# Patient Record
Sex: Male | Born: 1970 | Race: Black or African American | Hispanic: No | Marital: Single | Smoking: Former smoker
Health system: Southern US, Community
[De-identification: ages and names within clinical notes are randomized; demographics above are authoritative.]

---

## 2005-06-05 ENCOUNTER — Emergency Department: Payer: Self-pay | Admitting: General Practice

## 2007-11-22 ENCOUNTER — Emergency Department: Payer: Self-pay | Admitting: Emergency Medicine

## 2007-11-22 ENCOUNTER — Other Ambulatory Visit: Payer: Self-pay

## 2009-04-29 ENCOUNTER — Emergency Department: Payer: Self-pay | Admitting: Emergency Medicine

## 2009-05-12 ENCOUNTER — Emergency Department: Payer: Self-pay | Admitting: Emergency Medicine

## 2010-01-07 ENCOUNTER — Emergency Department: Payer: Self-pay | Admitting: Emergency Medicine

## 2011-11-29 ENCOUNTER — Emergency Department: Payer: Self-pay | Admitting: Emergency Medicine

## 2012-07-26 ENCOUNTER — Emergency Department: Payer: Self-pay | Admitting: Emergency Medicine

## 2012-08-08 ENCOUNTER — Emergency Department: Payer: Self-pay | Admitting: Emergency Medicine

## 2012-08-08 LAB — BASIC METABOLIC PANEL
Anion Gap: 7 (ref 7–16)
Calcium, Total: 8.6 mg/dL (ref 8.5–10.1)
Chloride: 106 mmol/L (ref 98–107)
Co2: 25 mmol/L (ref 21–32)
Creatinine: 1.04 mg/dL (ref 0.60–1.30)
EGFR (African American): >60
EGFR (Non-African Amer.): >60
Glucose: 182 mg/dL — ABNORMAL HIGH (ref 65–99)
Sodium: 138 mmol/L (ref 136–145)

## 2012-08-08 LAB — CBC
HGB: 14.3 g/dL (ref 13.0–18.0)
MCH: 27.1 pg (ref 26.0–34.0)
MCV: 78 fL — ABNORMAL LOW (ref 80–100)
Platelet: 255 10*3/uL (ref 150–440)
RBC: 5.3 10*6/uL (ref 4.40–5.90)
WBC: 9 10*3/uL (ref 3.8–10.6)

## 2013-09-01 ENCOUNTER — Emergency Department: Payer: Self-pay | Admitting: Emergency Medicine

## 2014-12-02 ENCOUNTER — Emergency Department: Payer: Self-pay

## 2014-12-02 ENCOUNTER — Emergency Department
Admission: EM | Admit: 2014-12-02 | Discharge: 2014-12-02 | Disposition: A | Discharge status: Home / Self Care | Payer: Self-pay | Attending: Emergency Medicine | Admitting: Emergency Medicine

## 2014-12-02 ENCOUNTER — Encounter: Payer: Self-pay | Admitting: Emergency Medicine

## 2014-12-02 DIAGNOSIS — S0012XA Contusion of left eyelid and periocular area, initial encounter: Secondary | ICD-10-CM | POA: Insufficient documentation

## 2014-12-02 DIAGNOSIS — S0083XA Contusion of other part of head, initial encounter: Secondary | ICD-10-CM | POA: Insufficient documentation

## 2014-12-02 DIAGNOSIS — Y9289 Other specified places as the place of occurrence of the external cause: Secondary | ICD-10-CM | POA: Insufficient documentation

## 2014-12-02 DIAGNOSIS — Y9389 Activity, other specified: Secondary | ICD-10-CM | POA: Insufficient documentation

## 2014-12-02 DIAGNOSIS — Y998 Other external cause status: Secondary | ICD-10-CM | POA: Insufficient documentation

## 2014-12-02 MED ORDER — IBUPROFEN 800 MG PO TABS: 800.0000 mg | ORAL_TABLET | Freq: Three times a day (TID) | ORAL | Status: DC | PRN

## 2014-12-02 NOTE — Discharge Instructions (Signed)
Contusion °A contusion is a deep bruise. Contusions are the result of an injury that caused bleeding under the skin. The contusion may turn blue, purple, or yellow. Minor injuries will give you a painless contusion, but more severe contusions may stay painful and swollen for a few weeks.  °CAUSES  °A contusion is usually caused by a blow, trauma, or direct force to an area of the body. °SYMPTOMS  °· Swelling and redness of the injured area. °· Bruising of the injured area. °· Tenderness and soreness of the injured area. °· Pain. °DIAGNOSIS  °The diagnosis can be made by taking a history and physical exam. An X-ray, CT scan, or MRI may be needed to determine if there were any associated injuries, such as fractures. °TREATMENT  °Specific treatment will depend on what area of the body was injured. In general, the best treatment for a contusion is resting, icing, elevating, and applying cold compresses to the injured area. Over-the-counter medicines may also be recommended for pain control. Ask your caregiver what the best treatment is for your contusion. °HOME CARE INSTRUCTIONS  °· Put ice on the injured area. °¨ Put ice in a plastic bag. °¨ Place a towel between your skin and the bag. °¨ Leave the ice on for 15-20 minutes, 3-4 times a day, or as directed by your health care provider. °· Only take over-the-counter or prescription medicines for pain, discomfort, or fever as directed by your caregiver. Your caregiver may recommend avoiding anti-inflammatory medicines (aspirin, ibuprofen, and naproxen) for 48 hours because these medicines may increase bruising. °· Rest the injured area. °· If possible, elevate the injured area to reduce swelling. °SEEK IMMEDIATE MEDICAL CARE IF:  °· You have increased bruising or swelling. °· You have pain that is getting worse. °· Your swelling or pain is not relieved with medicines. °MAKE SURE YOU:  °· Understand these instructions. °· Will watch your condition. °· Will get help right  away if you are not doing well or get worse. °Document Released: 03/15/2005 Document Revised: 06/10/2013 Document Reviewed: 04/10/2011 °ExitCare® Patient Information ©2015 [employer]. This information is not intended to replace advice given to you by your health care provider. Make sure you discuss any questions you have with your health care provider. ° °
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
## 2014-12-15 ENCOUNTER — Encounter: Payer: Self-pay | Admitting: Medical Oncology

## 2014-12-15 ENCOUNTER — Emergency Department
Admission: EM | Admit: 2014-12-15 | Discharge: 2014-12-15 | Disposition: A | Discharge status: Home / Self Care | Payer: Self-pay | Attending: Emergency Medicine | Admitting: Emergency Medicine

## 2014-12-15 ENCOUNTER — Emergency Department: Payer: Self-pay

## 2014-12-15 DIAGNOSIS — R042 Hemoptysis: Secondary | ICD-10-CM | POA: Insufficient documentation

## 2014-12-15 MED ORDER — BENZONATATE 200 MG PO CAPS: 200.0000 mg | ORAL_CAPSULE | Freq: Three times a day (TID) | ORAL | Status: DC | PRN

## 2014-12-15 NOTE — Discharge Instructions (Signed)
Hemoptysis Hemoptysis means coughing up blood. The blood may come from the lungs and airways. It can also come from bleeding that occurs outside the lungs and airways. Coughing up blood can be a sign of a minor problem or a serious medical condition.  HOME CARE  Only take medicine as told by your doctor. Do not use medicines that help you stop coughing (cough suppressants) unless your doctor approves.  If you are given antibiotic medicine, take it as told. Finish it even if you start to feel better.  Do not smoke. Also avoid being around others when they are smoking.  Follow up with your doctor as told. GET HELP RIGHT AWAY IF:  You cough up bloody spit (mucus) for longer than a week.  You have a blood-producing cough that is severe or getting worse.  You have a blood-producing cough thatcomes and goes over time.  You have trouble breathing.   You throw up (vomit) blood.  You have bloody or black poop (stool).  You have chest pain.   You have night sweats.  You feel faint or pass out.   You have a fever or lasting symptoms for more than 2-3 days.  You have a fever and your symptoms suddenly get worse. MAKE SURE YOU:  Understand these instructions.  Will watch your condition.  Will get help right away if you are not doing well or get worse. Document Released: 05/22/2012 Document Reviewed: 05/22/2012 ExitCare Patient Information 2015 [employer]. This information is not intended to replace advice given to you by your health care provider. Make sure you discuss any questions you have with your health care provider.  

## 2014-12-15 NOTE — ED Notes (Signed)
Pt ambulatory to triage with reports that he has had nasal congestion and cough x 2 days. This am when he coughed he noticed a little pink in his sputum.

## 2014-12-15 NOTE — ED Notes (Signed)
See provider notes for full assessment 

## 2014-12-15 NOTE — ED Provider Notes (Signed)
[facility] Emergency Department Provider Note     Time seen: Time stent  I have reviewed the triage vital signs and the nursing notes.   HISTORY  Chief Complaint Cough    HPI [name] is a 44 y.o. male who presents ER for cough and congestion for 2 days. Patient states when he coughed he knows some pink or blood tinged sputum. Has not had a history of same, denies fevers chills other complaints. Patient states the cough has not been severe, denies any other complaints.   History reviewed. No pertinent past medical history.  There are no active problems to display for this patient.   History reviewed. No pertinent past surgical history.  Allergies Review of patient's allergies indicates no known allergies.  Social History History  Substance Use Topics  . Smoking status: Never Smoker   . Smokeless tobacco: Not on file  . Alcohol Use: Yes     Comment: daily- 2 40oz beer    Review of Systems Constitutional: Negative for fever. Eyes: Negative for visual changes. ENT: Negative for sore throat. Cardiovascular: Negative for chest pain.  Respiratory: Negative for shortness of breath. Positive for cough Gastrointestinal: Negative for abdominal pain, vomiting and diarrhea. Genitourinary: Negative for dysuria. Musculoskeletal: Negative for back pain. Skin: Negative for rash. Neurological: Negative for headaches, focal weakness or numbness.  10-point ROS otherwise negative.  ____________________________________________   PHYSICAL EXAM:  VITAL SIGNS: ED Triage Vitals  Enc Vitals Group     BP 12/15/14 1514 137/88 mmHg     Pulse Rate 12/15/14 1514 98     Resp 12/15/14 1514 20     Temp 12/15/14 1514 98.1 F (36.7 C)     Temp Source 12/15/14 1514 Oral     SpO2 12/15/14 1514 95 %     Weight 12/15/14 1514 210 lb (95.255 kg)     Height 12/15/14 1514  (1.803 m)     Head Cir --      Peak Flow --      Pain Score --      Pain Loc  --      Pain Edu? --      Excl. in GC? --    Constitutional: Alert and oriented. Well appearing and in no distress. Eyes: Conjunctivae are normal. PERRL. Normal extraocular movements. ENT   Head: Normocephalic and atraumatic.   Nose: No congestion/rhinnorhea.   Mouth/Throat: Mucous membranes are moist.   Neck: No stridor. Hematological/Lymphatic/Immunilogical: No cervical lymphadenopathy. Cardiovascular: Normal rate, regular rhythm. Normal and symmetric distal pulses are present in all extremities. No murmurs, rubs, or gallops. Respiratory: Normal respiratory effort without tachypnea nor retractions. Breath sounds are clear and equal bilaterally. No wheezes/rales/rhonchi. Gastrointestinal: Soft and nontender. No distention. No abdominal bruits. There is no CVA tenderness. Musculoskeletal: Nontender with normal range of motion in all extremities. No joint effusions.  No lower extremity tenderness nor edema. Neurologic:  Normal speech and language. No gross focal neurologic deficits are appreciated. Speech is normal. No gait instability. Skin:  Skin is warm, dry and intact. No rash noted. Psychiatric: Mood and affect are normal. Speech and behavior are normal. Patient exhibits appropriate insight and judgment.  ____________________________________________  ED COURSE:  Pertinent labs & imaging results that were available during my care of the patient were reviewed by me and considered in my medical decision making (see chart for details). Likely blood-tinged sputum from forceful coughing or bronchitis. Patient will receive chest x-ray and reevaluation ____________________________________________  RADIOLOGY Images were viewed by me  Chest x-ray is unremarkable  ____________________________________________  FINAL ASSESSMENT AND PLAN  Hemoptysis  Plan: Patient is no acute distress, old charts were reviewed. X-rays stable. He'll be discharged with cough medication and  encouraged to follow up with his doctor if symptoms worsen.   [name], [name], MD   Emily FilbertJonathan E Williams, MD 12/15/14 1600
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
## 2015-09-29 IMAGING — CR DG CHEST 2V
1 series · 2 of 2 positions shown · non-contrast
Comparison: September 01, 2013

CLINICAL DATA: Two day history of cough and congestion

EXAM:
CHEST  2 VIEW

[Series 1: dg chest 2 view · 0.14mm/px · 2 of 2 slices shown]
[im 1/2]
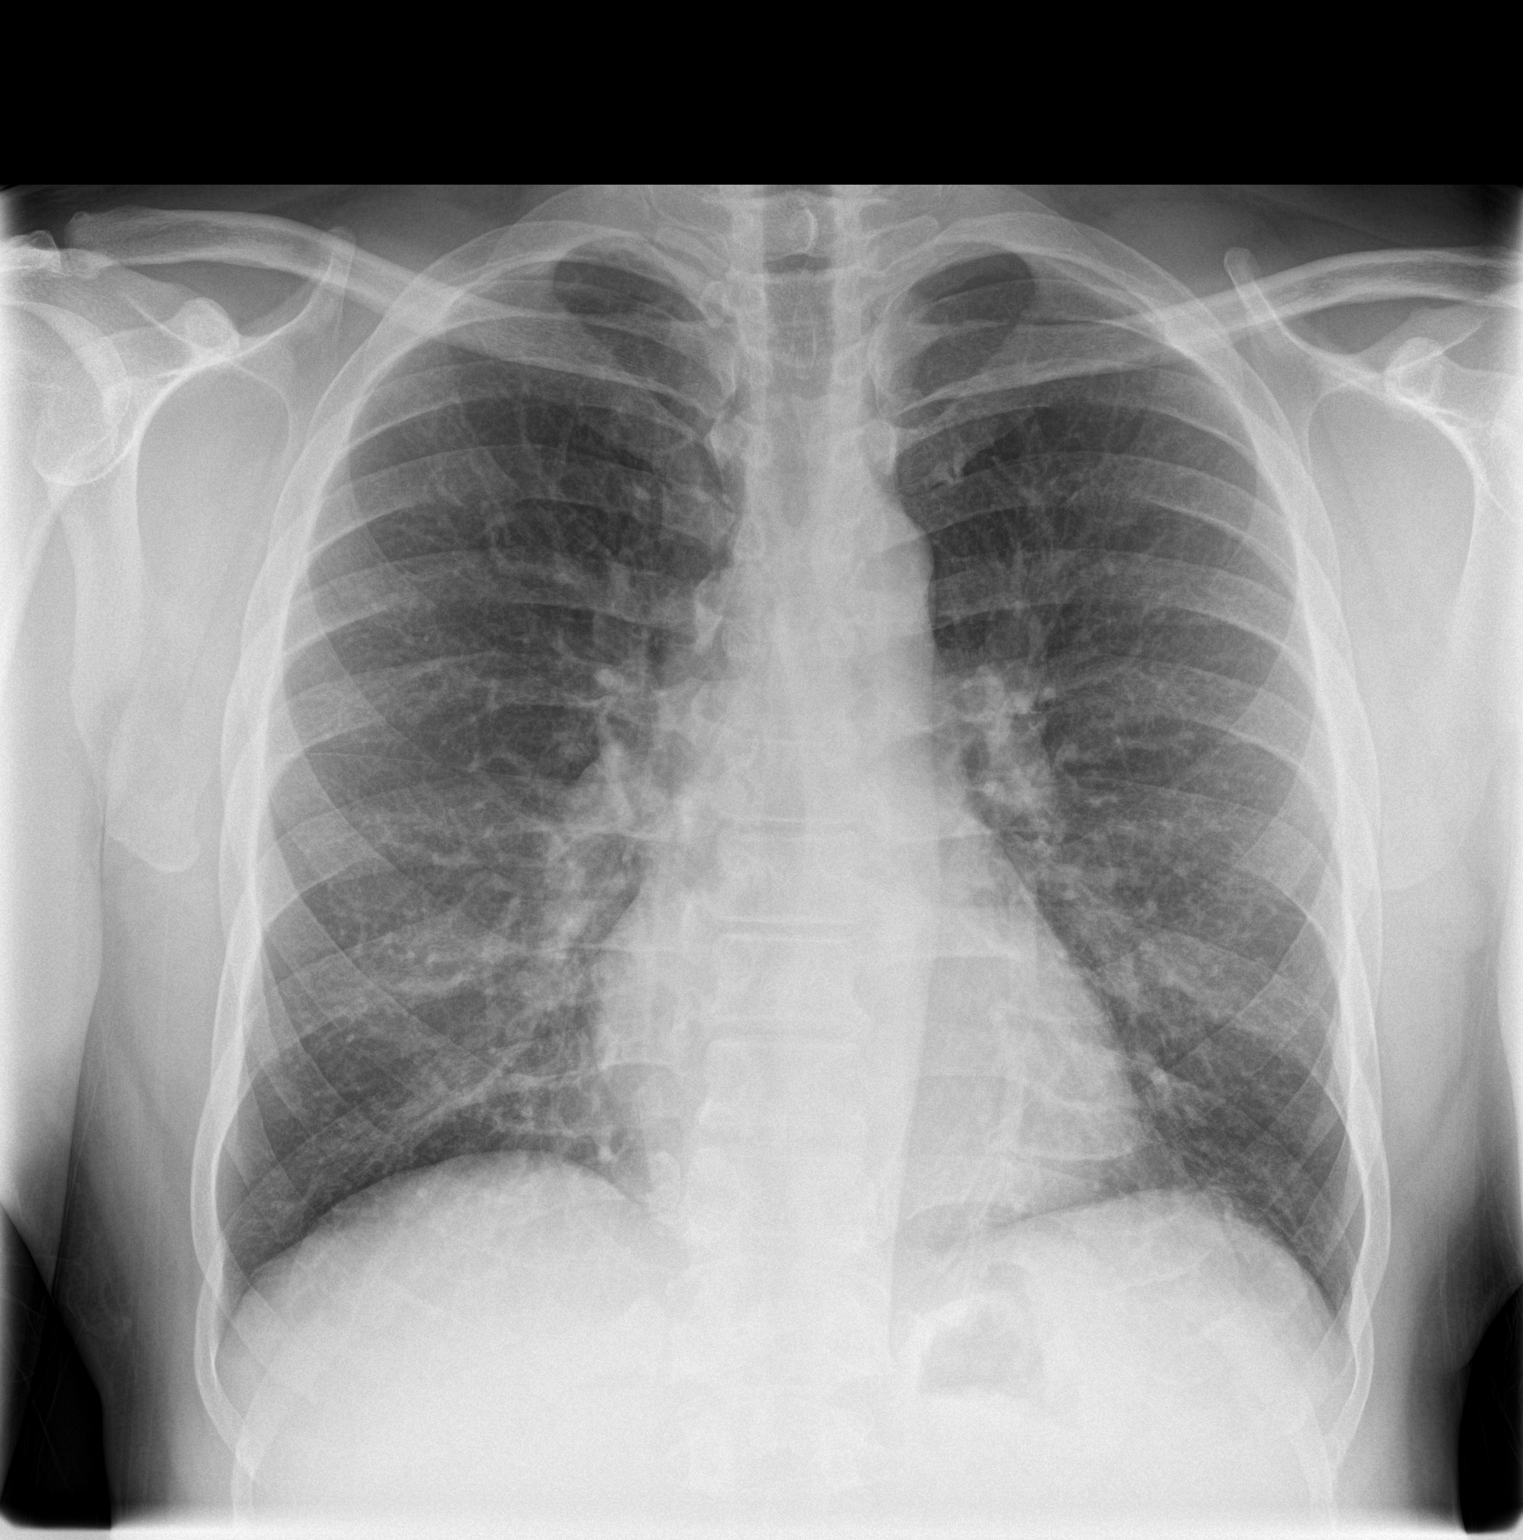
[im 2/2]
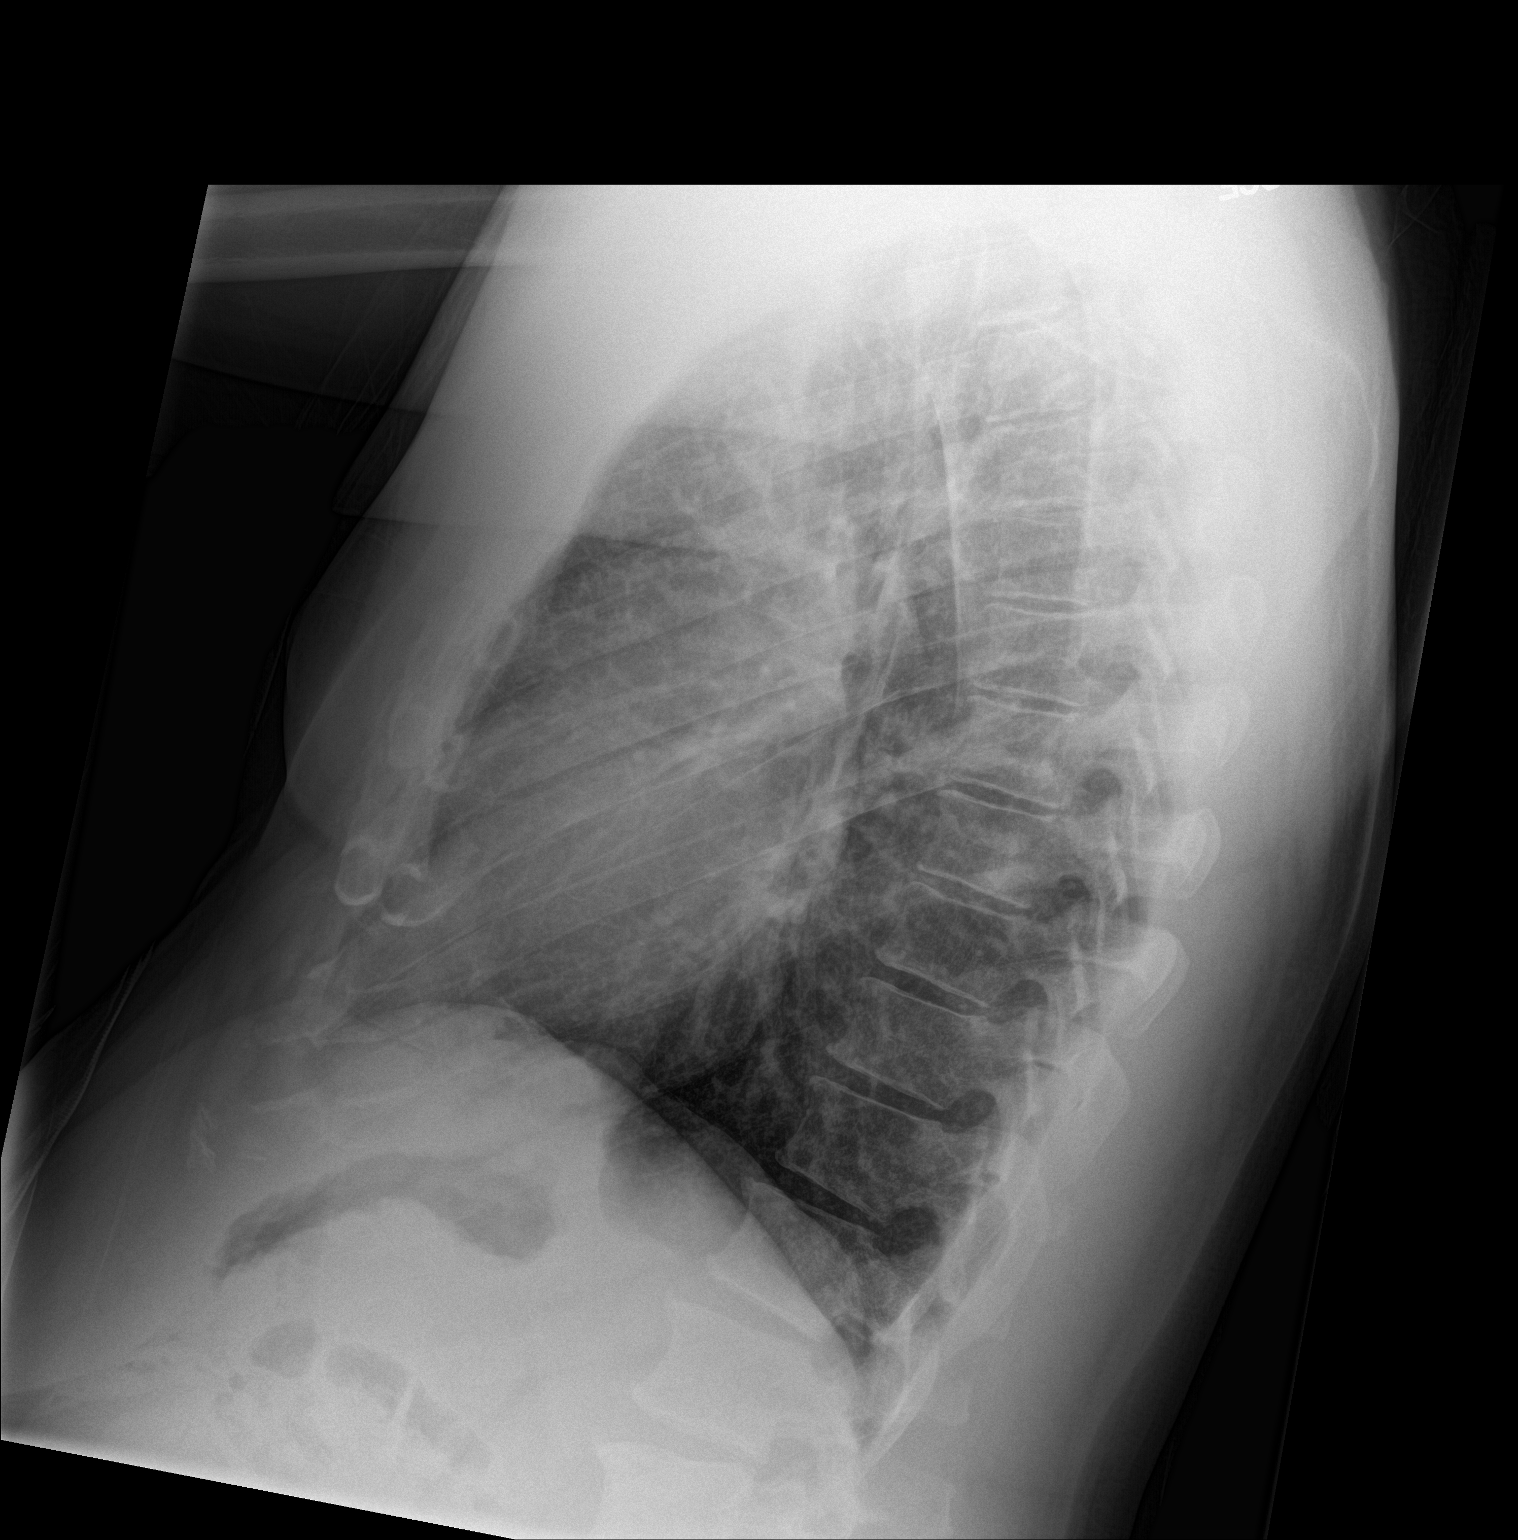

[2 of 2 positions shown; findings below may reference images not displayed]

FINDINGS: There is no edema or consolidation. The heart size and pulmonary
vascularity are normal. No adenopathy. No bone lesions.
IMPRESSION: No edema or consolidation.

## 2016-06-17 ENCOUNTER — Emergency Department
Admission: EM | Admit: 2016-06-17 | Discharge: 2016-06-17 | Disposition: A | Discharge status: Home / Self Care | Payer: Self-pay | Attending: Emergency Medicine | Admitting: Emergency Medicine

## 2016-06-17 ENCOUNTER — Emergency Department: Payer: Self-pay

## 2016-06-17 DIAGNOSIS — J101 Influenza due to other identified influenza virus with other respiratory manifestations: Secondary | ICD-10-CM

## 2016-06-17 DIAGNOSIS — J09X2 Influenza due to identified novel influenza A virus with other respiratory manifestations: Secondary | ICD-10-CM | POA: Insufficient documentation

## 2016-06-17 LAB — INFLUENZA PANEL BY PCR (TYPE A & B)
Influenza A By PCR: POSITIVE — AB
Influenza B By PCR: NEGATIVE

## 2016-06-17 MED ORDER — OSELTAMIVIR PHOSPHATE 75 MG PO CAPS: 75.0000 mg | ORAL_CAPSULE | Freq: Two times a day (BID) | ORAL | 0 refills | Status: AC

## 2016-06-17 NOTE — ED Notes (Signed)
Pt alert and oriented X4, active, cooperative, pt in NAD. RR even and unlabored, color WNL.  Pt informed to return if any life threatening symptoms occur.   

## 2016-06-17 NOTE — ED Triage Notes (Signed)
Pt states that he started with a bad cough last night

## 2016-06-18 NOTE — ED Provider Notes (Signed)
[facility] Emergency Department Provider Note  ____________________________________________  Time seen: Approximately 2:14 PM  I have reviewed the triage vital signs and the nursing notes.   HISTORY  Chief Complaint Cough    HPI Antonio Williams is a 45 y.o. male presenting to the emergency department with non-productive cough, malaise, myalgias and congestion. Patient states that symptoms started last night. However, nonproductive cough has been occurring for at least 4 days. Patient is unsure whether he has had fever. He has had chills. Patient denies chest pain, chest tightness, shortness of breath, abdominal pain, nausea, vomiting, dysuria and hematuria. He denies constipation and diarrhea. Patient has been around sick contacts. No recent travel. Patient has tried Tylenol, which has not relieved his symptoms.   No past medical history on file.  There are no active problems to display for this patient.   No past surgical history on file.  Prior to Admission medications   Medication Sig Start Date End Date Taking? Authorizing Provider  benzonatate (TESSALON) 200 MG capsule Take 1 capsule (200 mg total) by mouth 3 (three) times daily as needed for cough. 12/15/14   Emily FilbertJonathan E Williams, MD  ibuprofen (ADVIL,MOTRIN) 800 MG tablet Take 1 tablet (800 mg total) by mouth every 8 (eight) hours as needed. 12/02/14   Chinita Pesterari B Triplett, FNP  oseltamivir (TAMIFLU) 75 MG capsule Take 1 capsule (75 mg total) by mouth 2 (two) times daily. 06/17/16 06/22/16  Orvil FeilJaclyn M Woods, PA-C    Allergies Patient has no known allergies.  No family history on file.  Social History Social History  Substance Use Topics  . Smoking status: Never Smoker  . Smokeless tobacco: Not on file  . Alcohol use Yes     Comment: daily- 2 40oz beer     Review of Systems  Constitutional: Has chills Eyes: No visual changes. No discharge ENT: Has congestion and non productive  cough. Cardiovascular: no chest pain. Respiratory: No SOB. Gastrointestinal: No abdominal pain. No nausea, no vomiting.  No diarrhea.  No constipation. Genitourinary: Negative for dysuria. No hematuria. Musculoskeletal: Patient has myalgias. Skin: Negative for rash Neurological: Negative for headaches, focal weakness or numbness. 10-point ROS otherwise negative.  ____________________________________________   PHYSICAL EXAM:  VITAL SIGNS: ED Triage Vitals  Enc Vitals Group     BP 06/17/16 1250 (!) 136/97     Pulse Rate 06/17/16 1250 (!) 114     Resp 06/17/16 1250 20     Temp 06/17/16 1250 100.1 F (37.8 C)     Temp Source 06/17/16 1250 Oral     SpO2 06/17/16 1250 99 %     Weight 06/17/16 1251 205 lb (93 kg)     Height 06/17/16 1251 5\' 11"  (1.803 m)     Head Circumference --      Peak Flow --      Pain Score --      Pain Loc --      Pain Edu? --      Excl. in GC? --      Constitutional: Alert and oriented.  Eyes: Patient has bilateral conjunctivitis. Head: Atraumatic. ENT:      Ears: Tympanic membranes are injected bilaterally. No purulent exudate visualized bilaterally. Tympanic membranes are not bulging.      Nose: Nasal turbinates are erythematous. Copious rhinorrhea visualized.      Mouth/Throat: Mucous membranes are moist. The posterior pharynx is mildly erythematous. No tonsillar exudate or hypertrophy. Tonsils assessed at 2+. Uvula is midline. Hematological/Lymphatic/Immunilogical: No  cervical lymphadenopathy. Cardiovascular: Normal rate, regular rhythm. Normal S1 and S2.  Good peripheral circulation. No murmurs, gallops or rubs auscultated. Respiratory: Normal respiratory effort without tachypnea or retractions. Lungs CTAB. Good air entry to the bases with no decreased or absent breath sounds. Gastrointestinal: Bowel sounds 4 quadrants. Soft and nontender to palpation. No guarding or rigidity. No palpable masses. No distention. No CVA tenderness. Skin:  Skin is  warm, dry and intact. No rash noted. Psychiatric: Mood and affect are normal. Speech and behavior are normal. Patient exhibits appropriate insight and judgement.   ____________________________________________   LABS (all labs ordered are listed, but only abnormal results are displayed)  Labs Reviewed  INFLUENZA PANEL BY PCR (TYPE A & B, H1N1) - Abnormal; Notable for the following:       Result Value   Influenza A By PCR POSITIVE (*)    All other components within normal limits   ____________________________________________  EKG   ____________________________________________  RADIOLOGY Geraldo PitterI, Jaclyn M Woods, personally viewed and evaluated these images (plain radiographs) as part of my medical decision making, as well as reviewing the written report by the radiologist.  Dg Chest 2 View  Result Date: 06/17/2016 CLINICAL DATA:  Cough, congestion, runny nose X 4 days now. Nonsmoker. No previous heart or lung problems. EXAM: CHEST  2 VIEW COMPARISON:  12/15/2014 FINDINGS: The heart size and mediastinal contours are within normal limits. Both lungs are clear. The visualized skeletal structures are unremarkable. IMPRESSION: No active cardiopulmonary disease. Electronically Signed   By: Norva PavlovElizabeth  Brown M.D.   On: 06/17/2016 15:42    ____________________________________________    PROCEDURES  Procedure(s) performed:    Procedures    Medications - No data to display   ____________________________________________   INITIAL IMPRESSION / ASSESSMENT AND PLAN / ED COURSE  Pertinent labs & imaging results that were available during my care of the patient were reviewed by me and considered in my medical decision making (see chart for details).  Review of the [place] CSRS was performed in accordance of the [facility] prior to dispensing any controlled drugs.  Clinical Course     Assessment and Plan: Influenza A Patient presents to the emergency department with myalgias, nonproductive  cough, and fatigue. Patient tested positive for influenza A in the emergency department. DG chest reveals no consolidations or findings consistent with pneumonia. Patient education was provided regarding the course of influenza. Rest and hydration were encouraged. Patient was advised to follow-up with his primary care provider in one week. Tamiflu was prescribed at discharge. All patient questions were answered. Strict return precautions were given.    ____________________________________________  FINAL CLINICAL IMPRESSION(S) / ED DIAGNOSES  Final diagnoses:  Influenza A      NEW MEDICATIONS STARTED DURING THIS VISIT:  Discharge Medication List as of 06/17/2016  4:40 PM    START taking these medications   Details  oseltamivir (TAMIFLU) 75 MG capsule Take 1 capsule (75 mg total) by mouth 2 (two) times daily., Starting Sat 06/17/2016, Until Thu 06/22/2016, Print            This chart was dictated using voice recognition software/Dragon. Despite best efforts to proofread, errors can occur which can change the meaning. Any change was purely unintentional.    Orvil FeilJaclyn M Woods, PA-C 06/18/16 1425    Governor Rooksebecca Lord, MD 06/18/16 [phone]

## 2016-09-09 ENCOUNTER — Encounter: Payer: Self-pay | Admitting: Emergency Medicine

## 2016-09-09 ENCOUNTER — Emergency Department: Payer: Self-pay

## 2016-09-09 ENCOUNTER — Observation Stay
Admission: EM | Admit: 2016-09-09 | Discharge: 2016-09-10 | Disposition: A | Discharge status: Home / Self Care | Payer: Self-pay | Attending: Internal Medicine | Admitting: Internal Medicine

## 2016-09-09 DIAGNOSIS — Z833 Family history of diabetes mellitus: Secondary | ICD-10-CM | POA: Insufficient documentation

## 2016-09-09 DIAGNOSIS — J189 Pneumonia, unspecified organism: Principal | ICD-10-CM | POA: Diagnosis present

## 2016-09-09 DIAGNOSIS — R739 Hyperglycemia, unspecified: Secondary | ICD-10-CM | POA: Insufficient documentation

## 2016-09-09 DIAGNOSIS — Z87891 Personal history of nicotine dependence: Secondary | ICD-10-CM | POA: Insufficient documentation

## 2016-09-09 DIAGNOSIS — F1290 Cannabis use, unspecified, uncomplicated: Secondary | ICD-10-CM | POA: Insufficient documentation

## 2016-09-09 DIAGNOSIS — Z79899 Other long term (current) drug therapy: Secondary | ICD-10-CM | POA: Insufficient documentation

## 2016-09-09 DIAGNOSIS — E876 Hypokalemia: Secondary | ICD-10-CM | POA: Insufficient documentation

## 2016-09-09 DIAGNOSIS — Z8249 Family history of ischemic heart disease and other diseases of the circulatory system: Secondary | ICD-10-CM | POA: Insufficient documentation

## 2016-09-09 DIAGNOSIS — R0902 Hypoxemia: Secondary | ICD-10-CM | POA: Insufficient documentation

## 2016-09-09 LAB — COMPREHENSIVE METABOLIC PANEL
ALBUMIN: 3.8 g/dL (ref 3.5–5.0)
ALK PHOS: 66 U/L (ref 38–126)
ALT: 21 U/L (ref 17–63)
AST: 22 U/L (ref 15–41)
Anion gap: 8 (ref 5–15)
BILIRUBIN TOTAL: 0.7 mg/dL (ref 0.3–1.2)
BUN: 13 mg/dL (ref 6–20)
CALCIUM: 8.6 mg/dL — AB (ref 8.9–10.3)
CO2: 30 mmol/L (ref 22–32)
CREATININE: 1.14 mg/dL (ref 0.61–1.24)
Chloride: 98 mmol/L — ABNORMAL LOW (ref 101–111)
GFR calc Af Amer: >60 mL/min (ref >60)
GLUCOSE: 243 mg/dL — AB (ref 65–99)
POTASSIUM: 3.4 mmol/L — AB (ref 3.5–5.1)
Sodium: 136 mmol/L (ref 135–145)
TOTAL PROTEIN: 8.2 g/dL — AB (ref 6.5–8.1)

## 2016-09-09 LAB — CBC WITH DIFFERENTIAL/PLATELET
Basophils Absolute: 0 10*3/uL (ref 0–0.1)
Basophils Relative: 0 %
EOS ABS: 0.1 10*3/uL (ref 0–0.7)
Eosinophils Relative: 1 %
HEMATOCRIT: 41 % (ref 40.0–52.0)
HEMOGLOBIN: 14.4 g/dL (ref 13.0–18.0)
LYMPHS ABS: 1.6 10*3/uL (ref 1.0–3.6)
Lymphocytes Relative: 15 %
MCH: 26.5 pg (ref 26.0–34.0)
MCHC: 35.1 g/dL (ref 32.0–36.0)
MCV: 75.5 fL — AB (ref 80.0–100.0)
MONOS PCT: 7 %
Monocytes Absolute: 0.8 10*3/uL (ref 0.2–1.0)
NEUTROS PCT: 77 %
Neutro Abs: 8.7 10*3/uL — ABNORMAL HIGH (ref 1.4–6.5)
Platelets: 153 10*3/uL (ref 150–440)
RBC: 5.42 MIL/uL (ref 4.40–5.90)
RDW: 14.8 % — AB (ref 11.5–14.5)
WBC: 11.2 10*3/uL — AB (ref 3.8–10.6)

## 2016-09-09 LAB — GLUCOSE, CAPILLARY: GLUCOSE-CAPILLARY: 252 mg/dL — AB (ref 65–99)

## 2016-09-09 LAB — TROPONIN I

## 2016-09-09 LAB — BRAIN NATRIURETIC PEPTIDE: B Natriuretic Peptide: 28 pg/mL (ref 0.0–100.0)

## 2016-09-09 LAB — FIBRIN DERIVATIVES D-DIMER (ARMC ONLY): Fibrin derivatives D-dimer (ARMC): 427.34 (ref 0.00–499.00)

## 2016-09-09 MED ORDER — POTASSIUM CHLORIDE CRYS ER 20 MEQ PO TBCR
40.0000 meq | EXTENDED_RELEASE_TABLET | Freq: Once | ORAL | Status: AC
  Administered 2016-09-09: 40 meq via ORAL
  Filled 2016-09-09: qty 2

## 2016-09-09 MED ORDER — DEXTROSE 5 % IV SOLN: 1.0000 g | Freq: Once | INTRAVENOUS | Status: DC

## 2016-09-09 MED ORDER — DEXTROSE 5 % IV SOLN
500.0000 mg | INTRAVENOUS | Status: DC
  Filled 2016-09-09: qty 500

## 2016-09-09 MED ORDER — BENZONATATE 100 MG PO CAPS: 100.0000 mg | ORAL_CAPSULE | Freq: Three times a day (TID) | ORAL | Status: DC | PRN

## 2016-09-09 MED ORDER — AZITHROMYCIN 500 MG IV SOLR
500.0000 mg | Freq: Once | INTRAVENOUS | Status: AC
  Administered 2016-09-09: 500 mg via INTRAVENOUS
  Filled 2016-09-09: qty 500

## 2016-09-09 MED ORDER — DEXTROMETHORPHAN POLISTIREX ER 30 MG/5ML PO SUER
30.0000 mg | Freq: Two times a day (BID) | ORAL | Status: DC
  Administered 2016-09-09 – 2016-09-10 (×2): 30 mg via ORAL
  Filled 2016-09-09 (×4): qty 5

## 2016-09-09 MED ORDER — GUAIFENESIN ER 600 MG PO TB12
600.0000 mg | ORAL_TABLET | Freq: Two times a day (BID) | ORAL | Status: DC
  Administered 2016-09-09 – 2016-09-10 (×2): 600 mg via ORAL
  Filled 2016-09-09 (×2): qty 1

## 2016-09-09 MED ORDER — DM-GUAIFENESIN ER 30-600 MG PO TB12: 1.0000 | ORAL_TABLET | Freq: Two times a day (BID) | ORAL | Status: DC

## 2016-09-09 MED ORDER — CEFTRIAXONE SODIUM-DEXTROSE 1-3.74 GM-% IV SOLR
1.0000 g | Freq: Once | INTRAVENOUS | Status: AC
  Administered 2016-09-09: 1 g via INTRAVENOUS
  Filled 2016-09-09: qty 50

## 2016-09-09 MED ORDER — VANCOMYCIN HCL IN DEXTROSE 1-5 GM/200ML-% IV SOLN
1000.0000 mg | Freq: Once | INTRAVENOUS | Status: AC
  Administered 2016-09-09: 1000 mg via INTRAVENOUS
  Filled 2016-09-09: qty 200

## 2016-09-09 MED ORDER — INSULIN ASPART 100 UNIT/ML ~~LOC~~ SOLN
0.0000 [IU] | Freq: Every day | SUBCUTANEOUS | Status: DC
  Administered 2016-09-09: 3 [IU] via SUBCUTANEOUS
  Filled 2016-09-09: qty 3

## 2016-09-09 MED ORDER — IPRATROPIUM-ALBUTEROL 0.5-2.5 (3) MG/3ML IN SOLN: 3.0000 mL | Freq: Four times a day (QID) | RESPIRATORY_TRACT | Status: DC | PRN

## 2016-09-09 MED ORDER — SODIUM CHLORIDE 0.9 % IV SOLN
INTRAVENOUS | Status: DC
  Administered 2016-09-09: 23:00:00 via INTRAVENOUS

## 2016-09-09 MED ORDER — INSULIN ASPART 100 UNIT/ML ~~LOC~~ SOLN
0.0000 [IU] | Freq: Three times a day (TID) | SUBCUTANEOUS | Status: DC
  Administered 2016-09-10 (×2): 3 [IU] via SUBCUTANEOUS
  Filled 2016-09-09 (×2): qty 3

## 2016-09-09 MED ORDER — DEXTROSE 5 % IV SOLN
1.0000 g | INTRAVENOUS | Status: DC
  Filled 2016-09-09: qty 10

## 2016-09-09 NOTE — ED Notes (Signed)
zithromax and vanc were ordered prior to the rocephin order and that is why rocephin is being given after the other two atb

## 2016-09-09 NOTE — H&P (Signed)
History and Physical   [facility] [place] @ [facility] Admission History and Physical [employer], D.O.    Patient Name: Antonio Williams MR#: [id number] Date of Birth: 1971-02-11 Date of Admission: 09/09/2016  Referring MD/NP/PA: Dr. Darnelle Catalan Primary Care Physician: No PCP Per Patient Patient coming from: Home Outpatient Specialists: None   Chief Complaint: Cough, SOB  HPI: Antonio Williams is a 46 y.o. male with no known medical history presents to the emergency department for evaluation of fever, productive cough, SOB.  Patient was in a usual state of health until four days ago when he developed cough productive of brownish phlegm associated with exertional dyspnea and fevers/chills.  He has not sought any medical attention for his symptoms.    Otherwise there has been no change in status. Patient has been taking medication as prescribed and there has been no recent change in medication or diet.  No recent antibiotics.  There has been no recent illness, hospitalizations, travel or sick contacts.    Patient denies, weakness, dizziness, chest pain, N/V/C/D, abdominal pain, dysuria/frequency, changes in mental status.   ED Course: Patient received Vanco, Rocephin, Azithro.  Review of Systems:  CONSTITUTIONAL: Positive fever/chills, fatigue, weakness. Negative weight gain/loss, headache. EYES: No blurry or double vision. ENT: No tinnitus, postnasal drip, redness or soreness of the oropharynx. RESPIRATORY: Positive cough, dyspnea, wheeze.  No hemoptysis.  CARDIOVASCULAR: No chest pain, palpitations, syncope, orthopnea. No lower extremity edema.  GASTROINTESTINAL: No nausea, vomiting, abdominal pain, diarrhea, constipation.  No hematemesis, melena or hematochezia. GENITOURINARY: No dysuria, frequency, hematuria. ENDOCRINE: No polyuria or nocturia. No heat or cold intolerance. HEMATOLOGY: No anemia, bruising, bleeding. INTEGUMENTARY: No rashes, ulcers, lesions. MUSCULOSKELETAL:  No arthritis, gout, dyspnea. NEUROLOGIC: No numbness, tingling, ataxia, seizure-type activity, weakness. PSYCHIATRIC: No anxiety, depression, insomnia.   PMH:  Remote history of asthma in childhood  History reviewed. No pertinent surgical history.   reports that he has quit smoking. He has never used smokeless tobacco. He reports that he drinks alcohol. He reports that he uses drugs, including Marijuana.  No Known Allergies  Mother died with CHF, complications from DM. Father alive and well.  Family history has been reviewed and confirmed with patient.   Prior to Admission medications   Medication Sig Start Date End Date Taking? Authorizing Provider  benzonatate (TESSALON) 200 MG capsule Take 1 capsule (200 mg total) by mouth 3 (three) times daily as needed for cough. 12/15/14   Emily Filbert, MD  ibuprofen (ADVIL,MOTRIN) 800 MG tablet Take 1 tablet (800 mg total) by mouth every 8 (eight) hours as needed. 12/02/14   Chinita Pester, FNP    Physical Exam: Vitals:   09/09/16 1805 09/09/16 1830  BP: (!) 147/88 (!) 167/100  Pulse: (!) 121 (!) 110  Resp: 18   Temp: 98.7 F (37.1 C)   TempSrc: Oral   SpO2: 94% 100%  Weight: 95.3 kg (210 lb)   Height: 5\' 9"  (1.753 m)     GENERAL: 46 y.o.-year-old male patient, well-developed, well-nourished lying in the bed in no acute distress.  Pleasant and cooperative.   HEENT: Head atraumatic, normocephalic. Pupils equal, round, reactive to light and accommodation. No scleral icterus. Extraocular muscles intact. Nares are patent. Oropharynx is clear. Mucus membranes moist. NECK: Supple, full range of motion. No JVD, no bruit heard. No thyroid enlargement, no tenderness, no cervical lymphadenopathy. CHEST: Scattered rhonchi.. No use of accessory muscles of respiration.  No reproducible chest wall tenderness.  CARDIOVASCULAR: S1, S2 normal. No murmurs,  rubs, or gallops. Cap refill <2 seconds. Pulses intact distally.  ABDOMEN: Soft,  nondistended, nontender. No rebound, guarding, rigidity. Normoactive bowel sounds present in all four quadrants. No organomegaly or mass. EXTREMITIES: No pedal edema, cyanosis, or clubbing. No calf tenderness or Homan's sign.  NEUROLOGIC: The patient is alert and oriented x 3. Cranial nerves II through XII are grossly intact with no focal sensorimotor deficit. Muscle strength 5/5 in all extremities. Sensation intact. Gait not checked. PSYCHIATRIC:  Normal affect, mood, thought content. SKIN: Warm, dry, and intact without obvious rash, lesion, or ulcer.    Labs on Admission:  CBC:  Recent Labs Lab 09/09/16 1820  WBC 11.2*  NEUTROABS 8.7*  HGB 14.4  HCT 41.0  MCV 75.5*  PLT 153   Basic Metabolic Panel:  Recent Labs Lab 09/09/16 1820  NA 136  K 3.4*  CL 98*  CO2 30  GLUCOSE 243*  BUN 13  CREATININE 1.14  CALCIUM 8.6*   GFR: Estimated Creatinine Clearance: 90.2 mL/min (by C-G formula based on SCr of 1.14 mg/dL). Liver Function Tests:  Recent Labs Lab 09/09/16 1820  AST 22  ALT 21  ALKPHOS 66  BILITOT 0.7  PROT 8.2*  ALBUMIN 3.8   No results for input(s): LIPASE, AMYLASE in the last 168 hours. No results for input(s): AMMONIA in the last 168 hours. Coagulation Profile: No results for input(s): INR, PROTIME in the last 168 hours. Cardiac Enzymes:  Recent Labs Lab 09/09/16 1820  TROPONINI <0.03   BNP (last 3 results) No results for input(s): PROBNP in the last 8760 hours. HbA1C: No results for input(s): HGBA1C in the last 72 hours. CBG: No results for input(s): GLUCAP in the last 168 hours. Lipid Profile: No results for input(s): CHOL, HDL, LDLCALC, TRIG, CHOLHDL, LDLDIRECT in the last 72 hours. Thyroid Function Tests: No results for input(s): TSH, T4TOTAL, FREET4, T3FREE, THYROIDAB in the last 72 hours. Anemia Panel: No results for input(s): VITAMINB12, FOLATE, FERRITIN, TIBC, IRON, RETICCTPCT in the last 72 hours. Urine analysis: No results found  for: COLORURINE, APPEARANCEUR, LABSPEC, PHURINE, GLUCOSEU, HGBUR, BILIRUBINUR, KETONESUR, PROTEINUR, UROBILINOGEN, NITRITE, LEUKOCYTESUR Sepsis Labs: @LABRCNTIP (procalcitonin:4,lacticidven:4) )No results found for this or any previous visit (from the past 240 hour(s)).   Radiological Exams on Admission: Dg Chest 2 View  Result Date: 09/09/2016 CLINICAL DATA:  Acute onset of productive cough and shortness of breath. Intermittent fever. Initial encounter. EXAM: CHEST  2 VIEW COMPARISON:  Chest radiograph performed 06/17/2016 FINDINGS: The lungs are well-aerated. Vascular congestion is noted. Increased interstitial markings raise concern for mild interstitial edema. There is no evidence of pleural effusion or pneumothorax. The heart is normal in size; the mediastinal contour is within normal limits. No acute osseous abnormalities are seen. IMPRESSION: Vascular congestion. Increased interstitial markings raise concern for mild interstitial edema. Given the patient's symptoms, pneumonia could conceivably have a similar appearance. Electronically Signed   By: Roanna Raider M.D.   On: 09/09/2016 19:07    EKG: Sinus tach at 109bpm with leftward axis and nonspecific ST-T wave changes.   Assessment/Plan  This is a 46 y.o. male with no significant past medical history now being admitted with:  #. Community Acquired Pneumonia with hypoxia - Admit to observation - IV Rocephin & Azithromycin per pharmacy - IV fluid hydration - Duonebs, expectorants & O2 therapy as needed - Follow up blood & sputum cultures  #. Hyperglycemia - Accuchecks achs with RISS - Check A1c  #. Hypokalemia, mild - Replace PO  Admission status: Observation IV  Fluids: NS Diet/Nutrition: Carb controlled Consults called: none  DVT Px:  SCDs and early ambulation. Code Status: Full Code  Disposition Plan: To home <24 hours  All the records are reviewed and case discussed with ED provider. Management plans discussed with  the patient and/or family who express understanding and agree with plan of care.  Alisson Rozell D.O. on 09/09/2016 at 9:18 PM Between 7am to 6pm - Pager - [phone] After 6pm go to [email or web address] - [email or web address] [email or web address] [facility] [place] [facility] Office [phone] CC: Primary care physician; No PCP Per Patient   09/09/2016, 9:18 PM

## 2016-09-09 NOTE — ED Triage Notes (Addendum)
Pt c/o intermittent fevers since Tuesday with cough.  Has been taking tylenol. Took tylenol around lunch time today. Yellow productive cough. NAD.  Has had some SHOB with exertion. Unlabored at this time. Pt only drinks on weekends.

## 2016-09-09 NOTE — ED Provider Notes (Signed)
[facility] Emergency Department Provider Note   ____________________________________________   First MD Initiated Contact with Patient 09/09/16 1815     (approximate)  I have reviewed the triage vital signs and the nursing notes.   HISTORY  Chief Complaint No chief complaint on file.  Chief complaint is fever and cough HPI Antonio Williams is a 46 y.o. male patient reports intermittent fever and cough productive of beige phlegm since Tuesday. He reports he somewhat short of breath with exertion. He does not have any other complaints. He does smoke occasionally and has some chronic bronchitis but no other medical problems he says.   History reviewed. No pertinent past medical history.  There are no active problems to display for this patient.   History reviewed. No pertinent surgical history.  Prior to Admission medications   Medication Sig Start Date End Date Taking? Authorizing Provider  benzonatate (TESSALON) 200 MG capsule Take 1 capsule (200 mg total) by mouth 3 (three) times daily as needed for cough. 12/15/14   Emily Filbert, MD  ibuprofen (ADVIL,MOTRIN) 800 MG tablet Take 1 tablet (800 mg total) by mouth every 8 (eight) hours as needed. 12/02/14   Chinita Pester, FNP    Allergies Patient has no known allergies.  History reviewed. No pertinent family history.  Social History Social History  Substance Use Topics  . Smoking status: Former Games developer  . Smokeless tobacco: Never Used  . Alcohol use Yes     Comment: daily- 2 40oz beer    Review of Systems Constitutional: fever Eyes: No visual changes. ENT: No sore throat. Cardiovascular: Denies chest pain. Respiratory: shortness of breath. Gastrointestinal: No abdominal pain.  No nausea, no vomiting.  No diarrhea.  No constipation. Genitourinary: Negative for dysuria. Musculoskeletal: Negative for back pain. Skin: Negative for rash. Neurological: Negative for headaches, focal  weakness or numbness.  10-point ROS otherwise negative.  ____________________________________________   PHYSICAL EXAM:  VITAL SIGNS: ED Triage Vitals [09/09/16 1805]  Enc Vitals Group     BP (!) 147/88     Pulse Rate (!) 121     Resp 18     Temp 98.7 F (37.1 C)     Temp Source Oral     SpO2 94 %     Weight 210 lb (95.3 kg)     Height 5\' 9"  (1.753 m)     Head Circumference      Peak Flow      Pain Score      Pain Loc      Pain Edu?      Excl. in GC?     Constitutional: Alert and oriented. Well appearing and in no acute distress. Eyes: Conjunctivae are normal. PERRL. EOMI. Head: Atraumatic. Nose: No congestion/rhinnorhea. Mouth/Throat: Mucous membranes are moist.  Oropharynx non-erythematous. Neck: No stridor.   Cardiovascular:Rapid rate, regular rhythm. Grossly normal heart sounds.  Good peripheral circulation. Respiratory: Normal respiratory effort.  No retractions. Lungs CTAB. Gastrointestinal: Soft and nontender. No distention. No abdominal bruits. No CVA tenderness. Musculoskeletal: No lower extremity tenderness nor edema.  No joint effusions. Neurologic:  Normal speech and language. No gross focal neurologic deficits are appreciated. No gait instability. Skin:  Skin is warm, dry and intact. No rash noted.   ____________________________________________   LABS (all labs ordered are listed, but only abnormal results are displayed)  Labs Reviewed  CBC WITH DIFFERENTIAL/PLATELET - Abnormal; Notable for the following:       Result Value   WBC  11.2 (*)    MCV 75.5 (*)    RDW 14.8 (*)    Neutro Abs 8.7 (*)    All other components within normal limits  COMPREHENSIVE METABOLIC PANEL - Abnormal; Notable for the following:    Potassium 3.4 (*)    Chloride 98 (*)    Glucose, Bld 243 (*)    Calcium 8.6 (*)    Total Protein 8.2 (*)    All other components within normal limits  CULTURE, BLOOD (ROUTINE X 2)  CULTURE, BLOOD (ROUTINE X 2)  TROPONIN I  FIBRIN  DERIVATIVES D-DIMER ([facility] ONLY)  BRAIN NATRIURETIC PEPTIDE   ____________________________________________  EKG  EKG read and interpreted by me shows sinus tachycardia rate of 109 left axis no acute ST-T wave changes I should add that the monitor has shown a heart rate as high as 121 ____________________________________________  RADIOLOGY  Study Result   CLINICAL DATA:  Acute onset of productive cough and shortness of breath. Intermittent fever. Initial encounter.  EXAM: CHEST  2 VIEW  COMPARISON:  Chest radiograph performed 06/17/2016  FINDINGS: The lungs are well-aerated. Vascular congestion is noted. Increased interstitial markings raise concern for mild interstitial edema. There is no evidence of pleural effusion or pneumothorax.  The heart is normal in size; the mediastinal contour is within normal limits. No acute osseous abnormalities are seen.  IMPRESSION: Vascular congestion. Increased interstitial markings raise concern for mild interstitial edema. Given the patient's symptoms, pneumonia could conceivably have a similar appearance.   Electronically Signed   By: Roanna RaiderJeffery  Chang M.D.   On: 09/09/2016 19:07    ____________________________________________   PROCEDURES  Procedure(s) performed:  Procedures  Critical Care performed:  ____________________________________________   INITIAL IMPRESSION / ASSESSMENT AND PLAN / ED COURSE  Pertinent labs & imaging results that were available during my care of the patient were reviewed by me and considered in my medical decision making (see chart for details).   Patient's BNP and d-dimer are within normal limits. Patient gets up and walks back and forth in front of the bed and his O2 saturation dropped down from 98% to 87% on room air his heart rate goes from about 110 up to 141 after walking approximately 20 feet. I will put him in the hospital.      ____________________________________________   FINAL CLINICAL IMPRESSION(S) / ED DIAGNOSES  Final diagnoses:  Community acquired pneumonia, unspecified laterality      NEW MEDICATIONS STARTED DURING THIS VISIT:  New Prescriptions   No medications on file     Note:  This document was prepared using Dragon voice recognition software and may include unintentional dictation errors.    Arnaldo NatalPaul F Lilyanah Celestin, MD 09/09/16 2010

## 2016-09-10 LAB — GLUCOSE, CAPILLARY
GLUCOSE-CAPILLARY: 164 mg/dL — AB (ref 65–99)
Glucose-Capillary: 195 mg/dL — ABNORMAL HIGH (ref 65–99)

## 2016-09-10 LAB — BASIC METABOLIC PANEL
Anion gap: 6 (ref 5–15)
BUN: 10 mg/dL (ref 6–20)
CHLORIDE: 101 mmol/L (ref 101–111)
CO2: 26 mmol/L (ref 22–32)
Calcium: 8 mg/dL — ABNORMAL LOW (ref 8.9–10.3)
Creatinine, Ser: 0.94 mg/dL (ref 0.61–1.24)
GFR calc non Af Amer: >60 mL/min (ref >60)
GLUCOSE: 187 mg/dL — AB (ref 65–99)
POTASSIUM: 3.6 mmol/L (ref 3.5–5.1)
Sodium: 133 mmol/L — ABNORMAL LOW (ref 135–145)

## 2016-09-10 LAB — STREP PNEUMONIAE URINARY ANTIGEN: Strep Pneumo Urinary Antigen: NEGATIVE

## 2016-09-10 MED ORDER — LEVOFLOXACIN 750 MG PO TABS: 750.0000 mg | ORAL_TABLET | Freq: Every day | ORAL | 0 refills | Status: DC

## 2016-09-10 MED ORDER — GUAIFENESIN ER 600 MG PO TB12: 600.0000 mg | ORAL_TABLET | Freq: Two times a day (BID) | ORAL | 0 refills | Status: AC

## 2016-09-10 NOTE — Discharge Summary (Signed)
[facility] [place] at [place] [facility]   PATIENT NAME: Antonio Williams    MR#:  [id number]  DATE OF BIRTH:  05/12/1971  DATE OF ADMISSION:  09/09/2016 ADMITTING PHYSICIAN: Tonye RoyaltyAlexis Hugelmeyer, DO  DATE OF DISCHARGE: 09/10/3216  PRIMARY CARE PHYSICIAN: No PCP Per Patient    ADMISSION DIAGNOSIS:  Community acquired pneumonia, unspecified laterality [J18.9]  DISCHARGE DIAGNOSIS:  Active Problems:   Community acquired pneumonia   SECONDARY DIAGNOSIS:  History reviewed. No pertinent past medical history.  HOSPITAL COURSE:   46 y.o. male with no significant past medical history now being admitted with:  1. Community Acquired Pneumonia with hypoxia: patient was weaned off of Oxygen and has improved. He will discharged on Levaquin   2.. Hyperglycemia: At the time of discharge HgbA1c is still pending. Plan were elevated and therefore likely has prediabetes/diabetes Dietary was consulted Patient will follow up closely with her clinic for further evaluation  3. Hypokalemia: This was repleted   DISCHARGE CONDITIONS AND DIET:  Stable Diabetic diet  CONSULTS OBTAINED:    DRUG ALLERGIES:  No Known Allergies  DISCHARGE MEDICATIONS:   Current Discharge Medication List    START taking these medications   Details  guaiFENesin (MUCINEX) 600 MG 12 hr tablet Take 1 tablet (600 mg total) by mouth 2 (two) times daily. Qty: 12 tablet, Refills: 0    levofloxacin (LEVAQUIN) 750 MG tablet Take 1 tablet (750 mg total) by mouth daily. Qty: 5 tablet, Refills: 0          Today   CHIEF COMPLAINT:   Patient reports that his shortness of breath is improved. He is feeling better.   VITAL SIGNS:  Blood pressure 131/80, pulse (!) 118, temperature 99.8 F (37.7 C), temperature source Oral, resp. rate 18, height 5\' 9"  (1.753 m), weight 95.3 kg (210 lb), SpO2 93 %.   REVIEW OF SYSTEMS:  Review of Systems  Constitutional: Negative.  Negative for chills, fever and  malaise/fatigue.  HENT: Negative.  Negative for ear discharge, ear pain, hearing loss, nosebleeds and sore throat.   Eyes: Negative.  Negative for blurred vision and pain.  Respiratory: Positive for cough. Negative for hemoptysis, shortness of breath and wheezing.   Cardiovascular: Negative.  Negative for chest pain, palpitations and leg swelling.  Gastrointestinal: Negative.  Negative for abdominal pain, blood in stool, diarrhea, nausea and vomiting.  Genitourinary: Negative.  Negative for dysuria.  Musculoskeletal: Negative.  Negative for back pain.  Skin: Negative.   Neurological: Negative for dizziness, tremors, speech change, focal weakness, seizures and headaches.  Endo/Heme/Allergies: Negative.  Does not bruise/bleed easily.  Psychiatric/Behavioral: Negative.  Negative for depression, hallucinations and suicidal ideas.     PHYSICAL EXAMINATION:  GENERAL:  46 y.o.-year-old patient lying in the bed with no acute distress.  NECK:  Supple, no jugular venous distention. No thyroid enlargement, no tenderness.  LUNGS: Normal breath sounds bilaterally, no wheezing, rales,rhonchi  No use of accessory muscles of respiration.  CARDIOVASCULAR: S1, S2 normal. No murmurs, rubs, or gallops.  ABDOMEN: Soft, non-tender, non-distended. Bowel sounds present. No organomegaly or mass.  EXTREMITIES: No pedal edema, cyanosis, or clubbing.  PSYCHIATRIC: The patient is alert and oriented x 3.  SKIN: No obvious rash, lesion, or ulcer.   DATA REVIEW:   CBC  Recent Labs Lab 09/09/16 1820  WBC 11.2*  HGB 14.4  HCT 41.0  PLT 153    Chemistries   Recent Labs Lab 09/09/16 1820 09/10/16 0433  NA 136 133*  K 3.4* 3.6  CL 98* 101  CO2 30 26  GLUCOSE 243* 187*  BUN 13 10  CREATININE 1.14 0.94  CALCIUM 8.6* 8.0*  AST 22  --   ALT 21  --   ALKPHOS 66  --   BILITOT 0.7  --     Cardiac Enzymes  Recent Labs Lab 09/09/16 1820  TROPONINI <0.03    Microbiology Results   @[email or web address] @  RADIOLOGY:  Dg Chest 2 View  Result Date: 09/09/2016 CLINICAL DATA:  Acute onset of productive cough and shortness of breath. Intermittent fever. Initial encounter. EXAM: CHEST  2 VIEW COMPARISON:  Chest radiograph performed 06/17/2016 FINDINGS: The lungs are well-aerated. Vascular congestion is noted. Increased interstitial markings raise concern for mild interstitial edema. There is no evidence of pleural effusion or pneumothorax. The heart is normal in size; the mediastinal contour is within normal limits. No acute osseous abnormalities are seen. IMPRESSION: Vascular congestion. Increased interstitial markings raise concern for mild interstitial edema. Given the patient's symptoms, pneumonia could conceivably have a similar appearance. Electronically Signed   By: Roanna Raider M.D.   On: 09/09/2016 19:07      Current Discharge Medication List    START taking these medications   Details  guaiFENesin (MUCINEX) 600 MG 12 hr tablet Take 1 tablet (600 mg total) by mouth 2 (two) times daily. Qty: 12 tablet, Refills: 0    levofloxacin (LEVAQUIN) 750 MG tablet Take 1 tablet (750 mg total) by mouth daily. Qty: 5 tablet, Refills: 0            Management plans discussed with the patient and he is in agreement. Stable for discharge home  [facility]  CODE STATUS:     Code Status Orders        Start     Ordered   09/09/16 2240  Full code  Continuous     09/09/16 2239    Code Status History    Date Active Date Inactive Code Status Order ID Comments User Context   This patient has a current code status but no historical code status.      TOTAL TIME TAKING CARE OF THIS PATIENT: 37 minutes.    Note: This dictation was prepared with Dragon dictation along with smaller phrase technology. Any transcriptional errors that result from this process are unintentional.  Jasma Seevers M.D on 09/10/2016 at 10:29 AM  Between 7am to 6pm - Pager -  [phone] After 6pm go to [email or web address] - Social research officer, government  [facility]  Office  [phone]  CC: Primary care physician; No PCP Per Patient

## 2016-09-10 NOTE — Plan of Care (Signed)
Problem: Food- and Nutrition-Related Knowledge Deficit (NB-1.1) Goal: Nutrition education Formal process to instruct or train a patient/client in a skill or to impart knowledge to help patients/clients voluntarily manage or modify food choices and eating behavior to maintain or improve health. Outcome: Completed/Met Date Met: 09/10/16  RD consulted for nutrition education regarding diabetes.   No results found for: HGBA1C   Per chart patient may have pre-diabetes or DM, but still pending HgbA1c so unable to diagnose. [facility].  Spoke with patient and family member at bedside. Patient reports he has never been told he has high blood sugar before. He does have a family member with DM, so he is somewhat familiar with it. Patient reports eating 3 meals daily. Breakfast is usually eggs, sausage, grits, and two slices of toast. Lunch is [employer] 4 for $4 (double stack burger, nuggets, fries). Dinner is usually from [place] or [place] takeout. Patient reports he mainly drinks sodas back to back during the day while playing Internet sweepstakes with water occasionally.  RD provided "Carbohydrate Counting for People with Diabetes" handout from the Academy of Nutrition and Dietetics. Discussed different food groups and their effects on blood sugar, emphasizing carbohydrate-containing foods. Provided list of carbohydrates and recommended serving sizes of common foods.  RD provided "Using Nutrition Labels: Carbohydrates" handout from the Academy of Nutrition and Dietetics. Discussed how to read a nutrition label including looking at serving size and servings per container. Reviewed that there are 15 grams of carbohydrate in one carbohydrate choice. Encouraged patient to use chart for range of carbohydrate grams per choice when reading nutrition labels.  Discussed importance of controlled and consistent carbohydrate intake throughout the day. Provided examples of  ways to balance meals/snacks and encouraged intake of high-fiber, whole grain complex carbohydrates. Teach back method used.  Expect fair compliance.  Body mass index is 31.01 kg/m. Pt meets criteria for Obesity Class I based on current BMI.  Current diet order is CHO Modified, patient is consuming approximately 100% of meals at this time. Labs and medications reviewed. No further nutrition interventions warranted at this time. RD contact information provided. If additional nutrition issues arise, please re-consult RD.  Willey Blade, MS, RD, LDN Pager: [phone] After Hours Pager: [phone]

## 2016-09-10 NOTE — Progress Notes (Signed)
Ambulated with patient one lap around the nurses station.  His oxygen saturation was being monitored continuously and did not drop below 93% on room air.

## 2016-09-10 NOTE — Clinical Social Work Note (Signed)
CSW received consult that patient is having trouble acquiring his medications. This service is the purview of RNCM. The RNCM is aware. CSW signing off, but please consult if any needs arise.  Argentina PonderKaren Martha Adeleine Pask, MSW, Theresia MajorsLCSWA [phone]

## 2016-09-11 LAB — HEMOGLOBIN A1C
Hgb A1c MFr Bld: 8.4 % — ABNORMAL HIGH (ref 4.8–5.6)
Mean Plasma Glucose: 194 mg/dL

## 2016-09-11 LAB — HIV ANTIBODY (ROUTINE TESTING W REFLEX): HIV Screen 4th Generation wRfx: NONREACTIVE

## 2016-09-14 LAB — CULTURE, BLOOD (ROUTINE X 2)
CULTURE: NO GROWTH
Culture: NO GROWTH

## 2021-05-23 ENCOUNTER — Emergency Department
Admission: EM | Admit: 2021-05-23 | Discharge: 2021-05-23 | Disposition: A | Discharge status: Home / Self Care | Payer: Self-pay | Attending: Emergency Medicine | Admitting: Emergency Medicine

## 2021-05-23 ENCOUNTER — Other Ambulatory Visit: Payer: Self-pay

## 2021-05-23 DIAGNOSIS — H1033 Unspecified acute conjunctivitis, bilateral: Secondary | ICD-10-CM | POA: Insufficient documentation

## 2021-05-23 DIAGNOSIS — Z87891 Personal history of nicotine dependence: Secondary | ICD-10-CM | POA: Insufficient documentation

## 2021-05-23 MED ORDER — POLYMYXIN B-TRIMETHOPRIM 10000-0.1 UNIT/ML-% OP SOLN: 2.0000 [drp] | Freq: Four times a day (QID) | OPHTHALMIC | 0 refills | Status: AC

## 2021-05-23 NOTE — ED Triage Notes (Signed)
Pt via POV from home. Pt c/o bilateral eye pain and drainage since Thanksgiving. Pt states he had intermittent blurry vision. Pt is A&OX4 and NAD.

## 2021-05-23 NOTE — ED Provider Notes (Signed)
[facility] Emergency Department Provider Note  ____________________________________________  Time seen: Approximately 3:21 PM  I have reviewed the triage vital signs and the nursing notes.   HISTORY  Chief Complaint Eye Drainage    HPI Antonio Williams is a 50 y.o. male who presents emergency department complaining of bilateral eye drainage.  Patient had drainage and redness starting in the left eye and is now in the right eye.  No visual change.  Does not wear glasses or contacts.  No trauma.  Symptoms have been ongoing x4 days.  Patient denies any URI symptoms.  No other complaints at this time.  Patient is concerned he may have pinkeye       No past medical history on file.  Patient Active Problem List   Diagnosis Date Noted   Community acquired pneumonia 09/09/2016    No past surgical history on file.  Prior to Admission medications   Medication Sig Start Date End Date Taking? Authorizing Provider  trimethoprim-polymyxin b (POLYTRIM) ophthalmic solution Place 2 drops into both eyes every 6 (six) hours. 05/23/21  Yes Brendan Gadson, Delorise Royals, PA-C  levofloxacin (LEVAQUIN) 750 MG tablet Take 1 tablet (750 mg total) by mouth daily. 09/10/16   Adrian Saran, MD    Allergies Patient has no known allergies.  No family history on file.  Social History Social History   Tobacco Use   Smoking status: Former   Smokeless tobacco: Never  Substance Use Topics   Alcohol use: Yes    Comment: daily- 2 40oz beer   Drug use: Yes    Types: Marijuana     Review of Systems  Constitutional: No fever/chills Eyes: No visual changes.  No eye redness, purulent drainage from both eyes ENT: No upper respiratory complaints. Cardiovascular: no chest pain. Respiratory: no cough. No SOB. Gastrointestinal: No abdominal pain.  No nausea, no vomiting.  No diarrhea.  No constipation. Musculoskeletal: Negative for musculoskeletal pain. Skin: Negative for rash, abrasions,  lacerations, ecchymosis. Neurological: Negative for headaches, focal weakness or numbness.  10 System ROS otherwise negative.  ____________________________________________   PHYSICAL EXAM:  VITAL SIGNS: ED Triage Vitals  Enc Vitals Group     BP 05/23/21 1517 (!) 166/97     Pulse Rate 05/23/21 1517 97     Resp 05/23/21 1517 18     Temp 05/23/21 1517 98.7 F (37.1 C)     Temp Source 05/23/21 1517 Oral     SpO2 05/23/21 1517 99 %     Weight 05/23/21 1520 210 lb (95.3 kg)     Height 05/23/21 1520 5\' 11"  (1.803 m)     Head Circumference --      Peak Flow --      Pain Score 05/23/21 1520 4     Pain Loc --      Pain Edu? --      Excl. in GC? --      Constitutional: Alert and oriented. Well appearing and in no acute distress. Eyes: Conjunctivae are erythematous bilaterally.  No drainage noted at this time.  PERRL. EOMI. funduscopic exam is reassuring bilaterally. Head: Atraumatic. ENT:      Ears:       Nose: No congestion/rhinnorhea.      Mouth/Throat: Mucous membranes are moist.  Neck: No stridor.    Cardiovascular: Normal rate, regular rhythm. Normal S1 and S2.  Good peripheral circulation. Respiratory: Normal respiratory effort without tachypnea or retractions. Lungs CTAB. Good air entry to the bases with no decreased  or absent breath sounds. Musculoskeletal: Full range of motion to all extremities. No gross deformities appreciated. Neurologic:  Normal speech and language. No gross focal neurologic deficits are appreciated.  Skin:  Skin is warm, dry and intact. No rash noted. Psychiatric: Mood and affect are normal. Speech and behavior are normal. Patient exhibits appropriate insight and judgement.   ____________________________________________   LABS (all labs ordered are listed, but only abnormal results are displayed)  Labs Reviewed - No data to  display ____________________________________________  EKG   ____________________________________________  RADIOLOGY   No results found.  ____________________________________________    PROCEDURES  Procedure(s) performed:    Procedures    Medications - No data to display   ____________________________________________   INITIAL IMPRESSION / ASSESSMENT AND PLAN / ED COURSE  Pertinent labs & imaging results that were available during my care of the patient were reviewed by me and considered in my medical decision making (see chart for details).  Review of the [place] CSRS was performed in accordance of the [facility] prior to dispensing any controlled drugs.           Patient's diagnosis is consistent with conjunctivitis.  Patient presents emergency department with eye irritation and drainage.  Started left eye, now involves the right eye as well.  Vitals, exam is reassuring.  No trauma to the eye.  Patient will be placed on antibiotic eyedrops.  Follow-up primary care or oncology as needed.. . Patient is given ED precautions to return to the ED for any worsening or new symptoms.     ____________________________________________  FINAL CLINICAL IMPRESSION(S) / ED DIAGNOSES  Final diagnoses:  Acute bacterial conjunctivitis of both eyes      NEW MEDICATIONS STARTED DURING THIS VISIT:  ED Discharge Orders          Ordered    trimethoprim-polymyxin b (POLYTRIM) ophthalmic solution  Every 6 hours        05/23/21 1523                This chart was dictated using voice recognition software/Dragon. Despite best efforts to proofread, errors can occur which can change the meaning. Any change was purely unintentional.    Racheal Patches, PA-C 05/23/21 1524    Minna Antis, MD 05/23/21 [phone]

## 2022-05-25 ENCOUNTER — Encounter: Payer: Self-pay | Admitting: Emergency Medicine

## 2022-05-25 ENCOUNTER — Other Ambulatory Visit: Payer: Self-pay

## 2022-05-25 ENCOUNTER — Emergency Department: Payer: Medicaid Other

## 2022-05-25 ENCOUNTER — Emergency Department
Admission: EM | Admit: 2022-05-25 | Discharge: 2022-05-25 | Disposition: A | Discharge status: Home / Self Care | Payer: Medicaid Other | Attending: Emergency Medicine | Admitting: Emergency Medicine

## 2022-05-25 DIAGNOSIS — Z1152 Encounter for screening for COVID-19: Secondary | ICD-10-CM | POA: Diagnosis not present

## 2022-05-25 DIAGNOSIS — J205 Acute bronchitis due to respiratory syncytial virus: Secondary | ICD-10-CM | POA: Diagnosis not present

## 2022-05-25 DIAGNOSIS — R059 Cough, unspecified: Secondary | ICD-10-CM | POA: Diagnosis present

## 2022-05-25 LAB — RESP PANEL BY RT-PCR (RSV, FLU A&B, COVID)  RVPGX2
Influenza A by PCR: NEGATIVE
Influenza B by PCR: NEGATIVE
Resp Syncytial Virus by PCR: POSITIVE — AB
SARS Coronavirus 2 by RT PCR: NEGATIVE

## 2022-05-25 MED ORDER — PREDNISONE 10 MG PO TABS: ORAL_TABLET | ORAL | 0 refills | Status: AC

## 2022-05-25 MED ORDER — ALBUTEROL SULFATE HFA 108 (90 BASE) MCG/ACT IN AERS: 2.0000 | INHALATION_SPRAY | Freq: Four times a day (QID) | RESPIRATORY_TRACT | 0 refills | Status: AC | PRN

## 2022-05-25 MED ORDER — ACETAMINOPHEN 325 MG PO TABS
650.0000 mg | ORAL_TABLET | Freq: Once | ORAL | Status: AC
  Administered 2022-05-25: 650 mg via ORAL
  Filled 2022-05-25: qty 2

## 2022-05-25 NOTE — ED Provider Notes (Signed)
[facility] Provider Note    Event Date/Time   First MD Initiated Contact with Patient 05/25/22 1745     (approximate)   History   Cough (Productive cough (yellow phlegm) x 1 week; Denies fevers/chills, no NVD)   HPI  Antonio Williams is a 51 y.o. male   to the ED with complaint of productive cough for 1 week.  Patient denies any fever, chills, nausea or vomiting.  No known sick contacts.  Patient's reports that he discontinued smoking approximately 3 weeks ago.  No over-the-counter medications have been taken.      Physical Exam   Triage Vital Signs: ED Triage Vitals  Enc Vitals Group     BP 05/25/22 1606 (!) 153/85     Pulse Rate 05/25/22 1606 (!) 120     Resp 05/25/22 1606 18     Temp 05/25/22 1606 99.2 F (37.3 C)     Temp Source 05/25/22 1606 Oral     SpO2 05/25/22 1606 95 %     Weight 05/25/22 1606 188 lb (85.3 kg)     Height 05/25/22 1606 5\' 11"  (1.803 m)     Head Circumference --      Peak Flow --      Pain Score 05/25/22 1614 0     Pain Loc --      Pain Edu? --      Excl. in GC? --     Most recent vital signs: Vitals:   05/25/22 1606 05/25/22 1827  BP: (!) 153/85 (!) 162/91  Pulse: (!) 120 (!) 113  Resp: 18 18  Temp: 99.2 F (37.3 C) 99.1 F (37.3 C)  SpO2: 95% 95%     General: Awake, no distress.  Able to talk in complete sentences without any difficulty. CV:  Good peripheral perfusion.  Heart regular rate and rhythm. Resp:  Normal effort.  Lungs with mild expiratory wheeze heard throughout with a congested cough and occasional Rales. Abd:  No distention.  Other:     ED Results / Procedures / Treatments   Labs (all labs ordered are listed, but only abnormal results are displayed) Labs Reviewed  RESP PANEL BY RT-PCR (RSV, FLU A&B, COVID)  RVPGX2 - Abnormal; Notable for the following components:      Result Value   Resp Syncytial Virus by PCR POSITIVE (*)    All other components within normal limits       RADIOLOGY  Chest x-ray per radiologist shows slightly increased density lower bases consistent with pneumonitis superimposed on chronic changes.   PROCEDURES:  Critical Care performed:   Procedures   MEDICATIONS ORDERED IN ED: Medications  acetaminophen (TYLENOL) tablet 650 mg (has no administration in time range)     IMPRESSION / MDM / ASSESSMENT AND PLAN / ED COURSE  I reviewed the triage vital signs and the nursing notes.   Differential diagnosis includes, but is not limited to, COVID, influenza, RSV, pneumonia, bronchitis, upper respiratory infection with cough.  51 year old male presents to the ED with complaint of cough and congestion for 1 week without known sick contact.  Patient was made aware that his RSV test was positive but negative for influenza and COVID.  Chest x-ray showed chronic changes and findings suggestive of pneumonitis.  No definite infiltrate.  A prescription for prednisone and an albuterol inhaler was sent to the pharmacy for patient to begin using tonight.  He is to follow-up with his PCP at [facility] if any continued  problems and return to the emergency department if any severe worsening of his symptoms.  Patient also encouraged to take Tylenol or ibuprofen as needed for fever.     Patient's presentation is most consistent with acute complicated illness / injury requiring diagnostic workup.  FINAL CLINICAL IMPRESSION(S) / ED DIAGNOSES   Final diagnoses:  RSV bronchitis     Rx / DC Orders   ED Discharge Orders          Ordered    predniSONE (DELTASONE) 10 MG tablet        05/25/22 1833    albuterol (VENTOLIN HFA) 108 (90 Base) MCG/ACT inhaler  Every 6 hours PRN        05/25/22 1834             Note:  This document was prepared using Dragon voice recognition software and may include unintentional dictation errors.   Tommi Rumps, PA-C 05/25/22 1842    Dionne Bucy, MD 05/25/22 1919

## 2022-05-25 NOTE — Discharge Instructions (Signed)
Follow-up with [facility] health services if any continued problems.  A prescription for prednisone was sent to the pharmacy for you to begin taking for the next 6 days.  Drink fluids to stay hydrated.  Also an inhaler was sent to the pharmacy to use as needed for wheezing or shortness of breath.

## 2022-05-25 NOTE — ED Provider Triage Note (Signed)
Emergency Medicine Provider Triage Evaluation Note  Antonio Williams, a 51 y.o. male  was evaluated in triage.  Pt complains of cough.  Reports 1 week of productive cough.  Review of Systems  Positive: cough Negative: FCS  Physical Exam  BP (!) 153/85 (BP Location: Left Arm)   Pulse (!) 120   Temp 99.2 F (37.3 C) (Oral)   Resp 18   Ht 5\' 11"  (1.803 m)   Wt 85.3 kg   SpO2 95%   BMI 26.22 kg/m  Gen:   Awake, no distress  NAd Resp:  Normal effort rhonchi noted bilaterally MSK:   Moves extremities without difficulty  Other:    Medical Decision Making  Medically screening exam initiated at 4:19 PM.  Appropriate orders placed.  was informed that the remainder of the evaluation will be completed by another provider, this initial triage assessment does not replace that evaluation, and the importance of remaining in the ED until their evaluation is complete.  Patient to the ED for evaluation of 1 week of productive cough.   Antonio Quay, PA-C 05/25/22 1634

## 2023-03-15 DIAGNOSIS — R030 Elevated blood-pressure reading, without diagnosis of hypertension: Secondary | ICD-10-CM | POA: Diagnosis not present

## 2023-03-15 DIAGNOSIS — Z712 Person consulting for explanation of examination or test findings: Secondary | ICD-10-CM | POA: Diagnosis not present

## 2023-03-15 DIAGNOSIS — E1165 Type 2 diabetes mellitus with hyperglycemia: Secondary | ICD-10-CM | POA: Diagnosis not present

## 2023-03-15 DIAGNOSIS — Z0130 Encounter for examination of blood pressure without abnormal findings: Secondary | ICD-10-CM | POA: Diagnosis not present

## 2023-03-15 DIAGNOSIS — Z1389 Encounter for screening for other disorder: Secondary | ICD-10-CM | POA: Diagnosis not present

## 2023-03-15 DIAGNOSIS — I10 Essential (primary) hypertension: Secondary | ICD-10-CM | POA: Diagnosis not present

## 2023-03-15 DIAGNOSIS — F1020 Alcohol dependence, uncomplicated: Secondary | ICD-10-CM | POA: Diagnosis not present

## 2023-03-15 DIAGNOSIS — B351 Tinea unguium: Secondary | ICD-10-CM | POA: Diagnosis not present

## 2023-03-28 DIAGNOSIS — I10 Essential (primary) hypertension: Secondary | ICD-10-CM | POA: Diagnosis not present

## 2023-03-28 DIAGNOSIS — Z87891 Personal history of nicotine dependence: Secondary | ICD-10-CM | POA: Diagnosis not present

## 2023-03-28 DIAGNOSIS — E785 Hyperlipidemia, unspecified: Secondary | ICD-10-CM | POA: Diagnosis not present

## 2023-03-28 DIAGNOSIS — Z7984 Long term (current) use of oral hypoglycemic drugs: Secondary | ICD-10-CM | POA: Diagnosis not present

## 2023-03-28 DIAGNOSIS — E119 Type 2 diabetes mellitus without complications: Secondary | ICD-10-CM | POA: Diagnosis not present

## 2023-03-28 DIAGNOSIS — I700 Atherosclerosis of aorta: Secondary | ICD-10-CM | POA: Diagnosis not present

## 2023-04-18 DIAGNOSIS — B351 Tinea unguium: Secondary | ICD-10-CM | POA: Diagnosis not present

## 2023-04-18 DIAGNOSIS — R0989 Other specified symptoms and signs involving the circulatory and respiratory systems: Secondary | ICD-10-CM | POA: Diagnosis not present

## 2023-04-18 DIAGNOSIS — E1159 Type 2 diabetes mellitus with other circulatory complications: Secondary | ICD-10-CM | POA: Diagnosis not present

## 2023-04-18 DIAGNOSIS — M79674 Pain in right toe(s): Secondary | ICD-10-CM | POA: Diagnosis not present

## 2023-05-03 DIAGNOSIS — Z1159 Encounter for screening for other viral diseases: Secondary | ICD-10-CM | POA: Diagnosis not present

## 2023-05-03 DIAGNOSIS — Z1389 Encounter for screening for other disorder: Secondary | ICD-10-CM | POA: Diagnosis not present

## 2023-05-03 DIAGNOSIS — Z712 Person consulting for explanation of examination or test findings: Secondary | ICD-10-CM | POA: Diagnosis not present

## 2023-05-03 DIAGNOSIS — F1020 Alcohol dependence, uncomplicated: Secondary | ICD-10-CM | POA: Diagnosis not present

## 2023-05-03 DIAGNOSIS — R030 Elevated blood-pressure reading, without diagnosis of hypertension: Secondary | ICD-10-CM | POA: Diagnosis not present

## 2023-05-03 DIAGNOSIS — E1165 Type 2 diabetes mellitus with hyperglycemia: Secondary | ICD-10-CM | POA: Diagnosis not present

## 2023-05-03 DIAGNOSIS — R945 Abnormal results of liver function studies: Secondary | ICD-10-CM | POA: Diagnosis not present

## 2023-05-03 DIAGNOSIS — Z1331 Encounter for screening for depression: Secondary | ICD-10-CM | POA: Diagnosis not present

## 2023-05-03 DIAGNOSIS — Z0131 Encounter for examination of blood pressure with abnormal findings: Secondary | ICD-10-CM | POA: Diagnosis not present

## 2023-05-09 DIAGNOSIS — R945 Abnormal results of liver function studies: Secondary | ICD-10-CM | POA: Diagnosis not present

## 2023-05-09 DIAGNOSIS — R7889 Finding of other specified substances, not normally found in blood: Secondary | ICD-10-CM | POA: Diagnosis not present

## 2023-05-22 DIAGNOSIS — Z1211 Encounter for screening for malignant neoplasm of colon: Secondary | ICD-10-CM | POA: Diagnosis not present

## 2023-05-22 DIAGNOSIS — Z1212 Encounter for screening for malignant neoplasm of rectum: Secondary | ICD-10-CM | POA: Diagnosis not present
